# Patient Record
Sex: Female | Born: 1990 | Race: Black or African American | Hispanic: No | Smoking: Never smoker
Health system: Southern US, Community
[De-identification: ages and names within clinical notes are randomized; demographics above are authoritative.]

---

## 2020-08-03 ENCOUNTER — Encounter (HOSPITAL_BASED_OUTPATIENT_CLINIC_OR_DEPARTMENT_OTHER): Payer: Self-pay

## 2020-08-03 ENCOUNTER — Emergency Department (HOSPITAL_BASED_OUTPATIENT_CLINIC_OR_DEPARTMENT_OTHER): Payer: BC Managed Care – PPO

## 2020-08-03 ENCOUNTER — Other Ambulatory Visit: Payer: Self-pay

## 2020-08-03 ENCOUNTER — Emergency Department (HOSPITAL_BASED_OUTPATIENT_CLINIC_OR_DEPARTMENT_OTHER)
Admission: EM | Admit: 2020-08-03 | Discharge: 2020-08-03 | Disposition: A | Discharge status: Home / Self Care | Payer: BC Managed Care – PPO | Attending: Emergency Medicine | Admitting: Emergency Medicine

## 2020-08-03 DIAGNOSIS — Z3A22 22 weeks gestation of pregnancy: Secondary | ICD-10-CM | POA: Insufficient documentation

## 2020-08-03 DIAGNOSIS — O200 Threatened abortion: Secondary | ICD-10-CM

## 2020-08-03 DIAGNOSIS — O209 Hemorrhage in early pregnancy, unspecified: Secondary | ICD-10-CM

## 2020-08-03 LAB — URINALYSIS, ROUTINE W REFLEX MICROSCOPIC
Bilirubin Urine: NEGATIVE
Glucose, UA: NEGATIVE mg/dL
Ketones, ur: NEGATIVE mg/dL
Leukocytes,Ua: NEGATIVE
Nitrite: NEGATIVE
Protein, ur: NEGATIVE mg/dL
Specific Gravity, Urine: 1.009 (ref 1.005–1.030)
pH: 7 (ref 5.0–8.0)

## 2020-08-03 LAB — CBC
HCT: 36 % (ref 36.0–46.0)
Hemoglobin: 11.6 g/dL — ABNORMAL LOW (ref 12.0–15.0)
MCH: 26.9 pg (ref 26.0–34.0)
MCHC: 32.2 g/dL (ref 30.0–36.0)
MCV: 83.3 fL (ref 80.0–100.0)
Platelets: 273 10*3/uL (ref 150–400)
RBC: 4.32 MIL/uL (ref 3.87–5.11)
RDW: 14.5 % (ref 11.5–15.5)
WBC: 7.3 10*3/uL (ref 4.0–10.5)
nRBC: 0 % (ref 0.0–0.2)

## 2020-08-03 LAB — ABO/RH: ABO/RH(D): O POS

## 2020-08-03 LAB — HCG, QUANTITATIVE, PREGNANCY: hCG, Beta Chain, Quant, S: 8 m[IU]/mL — ABNORMAL HIGH (ref <5)

## 2020-08-03 NOTE — ED Provider Notes (Signed)
[facility] EMERGENCY DEPT Provider Note   CSN: [id number] Arrival date & time: 08/03/20  0820     History Chief Complaint  Patient presents with   Vaginal Bleeding    Patricia Kent is a 30 y.o. female.  Patient presents with mild cramping and bleeding for the past few days.  Patient had positive home pregnancy test.  No history of any pregnancies or miscarriage.  No fever chills or vomiting.  Symptoms mild currently.  Patient took a pregnancy test due to missed/delayed menstrual cycle.  Last menstrual cycle early May.  No syncope.  No concerns for STDs.  No urinary symptoms.      History reviewed. No pertinent past medical history.  There are no problems to display for this patient.   History reviewed. No pertinent surgical history.   OB History     Gravida  1   Para      Term      Preterm      AB      Living         SAB      IAB      Ectopic      Multiple      Live Births              No family history on file.  Social History   Tobacco Use   Smoking status: Never   Smokeless tobacco: Never  Substance Use Topics   Alcohol use: Yes    Comment: rarely   Drug use: Never    Home Medications Prior to Admission medications   Medication Sig Start Date End Date Taking? Authorizing Provider  acetaminophen (TYLENOL) 500 MG tablet Take 500 mg by mouth every 6 (six) hours as needed.   Yes [provider]  cetirizine (ZYRTEC) 10 MG tablet Take 10 mg by mouth daily.   Yes [provider]    Allergies    Patient has no known allergies.  Review of Systems   Review of Systems  Constitutional:  Negative for chills and fever.  HENT:  Negative for congestion.   Eyes:  Negative for visual disturbance.  Respiratory:  Negative for shortness of breath.   Cardiovascular:  Negative for chest pain.  Gastrointestinal:  Negative for abdominal pain and vomiting.  Genitourinary:  Positive for vaginal bleeding. Negative  for dysuria, flank pain and vaginal discharge.  Musculoskeletal:  Negative for back pain, neck pain and neck stiffness.  Skin:  Negative for rash.  Neurological:  Negative for light-headedness and headaches.   Physical Exam Updated Vital Signs BP 102/68   Pulse 68   Temp 98.1 F (36.7 C) (Oral)   Resp 16   Ht 5\' 4"  (1.626 m)   Wt 56.7 kg   LMP 06/29/2020 (Exact Date) Comment: 06-29-2020 was first day of LNMP  SpO2 100%   BMI 21.46 kg/m   Physical Exam Vitals and nursing note reviewed.  Constitutional:      General: She is not in acute distress.    Appearance: She is well-developed.  HENT:     Head: Normocephalic and atraumatic.     Mouth/Throat:     Mouth: Mucous membranes are dry.  Eyes:     General:        Right eye: No discharge.        Left eye: No discharge.     Conjunctiva/sclera: Conjunctivae normal.  Neck:     Trachea: No tracheal deviation.  Cardiovascular:  Rate and Rhythm: Normal rate.     Heart sounds: No murmur heard. Pulmonary:     Effort: Pulmonary effort is normal.  Abdominal:     General: There is no distension.     Palpations: Abdomen is soft.     Tenderness: There is no abdominal tenderness. There is no guarding.  Musculoskeletal:     Cervical back: Normal range of motion and neck supple. No rigidity.  Skin:    General: Skin is warm.     Capillary Refill: Capillary refill takes less than 2 seconds.     Findings: No rash.  Neurological:     General: No focal deficit present.     Mental Status: She is alert.     Cranial Nerves: No cranial nerve deficit.  Psychiatric:        Mood and Affect: Mood normal.    ED Results / Procedures / Treatments   Labs (all labs ordered are listed, but only abnormal results are displayed) Labs Reviewed  HCG, QUANTITATIVE, PREGNANCY - Abnormal; Notable for the following components:      Result Value   hCG, Beta Chain, Quant, S 8 (*)    All other components within normal limits  URINALYSIS, ROUTINE W  REFLEX MICROSCOPIC - Abnormal; Notable for the following components:   Hgb urine dipstick LARGE (*)    All other components within normal limits  CBC - Abnormal; Notable for the following components:   Hemoglobin 11.6 (*)    All other components within normal limits  ABO/RH    EKG None  Radiology US OB LESS THAN 14 WEEKS WITH OB TRANSVAGINAL  Result Date: 08/03/2020 CLINICAL DATA:  Vaginal bleeding for several days EXAM: OBSTETRIC <14 WK [place] AND TRANSVAGINAL OB US TECHNIQUE: Both transabdominal and transvaginal ultrasound examinations were performed for complete evaluation of the gestation as well as the maternal uterus, adnexal regions, and pelvic cul-de-sac. Transvaginal technique was performed to assess early pregnancy. COMPARISON:  None. FINDINGS: Intrauterine gestational sac: Absent. No extra uterine gestational sac is noted. Maternal uterus/adnexae: Uterus is retroflexed. Ovaries appear within normal limits. There is a complex area measuring 4.3 cm in greatest dimension in the midline likely representing multiple loops of bowel. IMPRESSION: No definitive intrauterine or extrauterine gestation is identified. Correlation with serial beta HCG levels is recommended. Follow-up imaging can be performed as clinically indicated. Complex area in the midline adjacent to the uterus likely representing a collection of bowel loops. Electronically Signed   By: Alcide Clever M.D.   On: 08/03/2020 11:30    Procedures Procedures   Medications Ordered in ED Medications - No data to display  ED Course  I have reviewed the triage vital signs and the nursing notes.  Pertinent labs & imaging results that were available during my care of the patient were reviewed by me and considered in my medical decision making (see chart for details).    MDM Rules/Calculators/A&P                          Patient presents with mild vaginal bleeding early pregnancy.  Concern clinically for threatened miscarriage versus  inevitable versus early versus ectopic.  hCG ordered and returned at 8 extremely low.  Ultrasound no intrauterine pregnancy seen.  Patient has minimal pain in the ER, normal vitals, normal hemoglobin reviewed.  Rh sent for outpatient follow-up.  Patient is to see OB doctor on Friday.  Work note given.  Urinalysis reviewed no signs of  infection mild hemoglobin.   Final Clinical Impression(s) / ED Diagnoses Final diagnoses:  Vaginal bleeding before [redacted] weeks gestation  Threatened miscarriage    Rx / DC Orders ED Discharge Orders     None        Blane Ohara, MD 08/03/20 1217

## 2020-08-03 NOTE — ED Notes (Signed)
Patient transported to Ultrasound 

## 2020-08-03 NOTE — ED Triage Notes (Signed)
She states she has tested positive for pregnancy. She is here for vag. Bleeding and moderate discomfort at pelvic area. She is in no distress.

## 2020-08-03 NOTE — Discharge Instructions (Addendum)
See OB doctor on Friday for reassessment and likely repeat blood work. Use Tylenol every 4 hours as needed for cramping.

## 2022-06-21 IMAGING — US US OB < 14 WEEKS - US OB TV
1 series · 14 of 28 positions shown · non-contrast
Comparison: None.

CLINICAL DATA: Vaginal bleeding for several days

EXAM:
OBSTETRIC <14 WK US AND TRANSVAGINAL OB US
TECHNIQUE: Both transabdominal and transvaginal ultrasound examinations were
performed for complete evaluation of the gestation as well as the
maternal uterus, adnexal regions, and pelvic cul-de-sac.
Transvaginal technique was performed to assess early pregnancy.

[Series 1: us ob less than 14 weeks with ob transvaginal · 80 acquisitions, 14 frames shown]
[im 3/80]
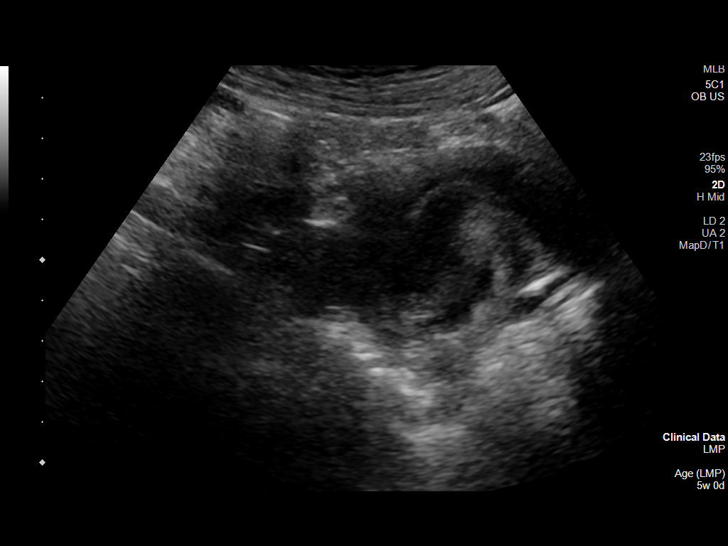
[im 9/80]
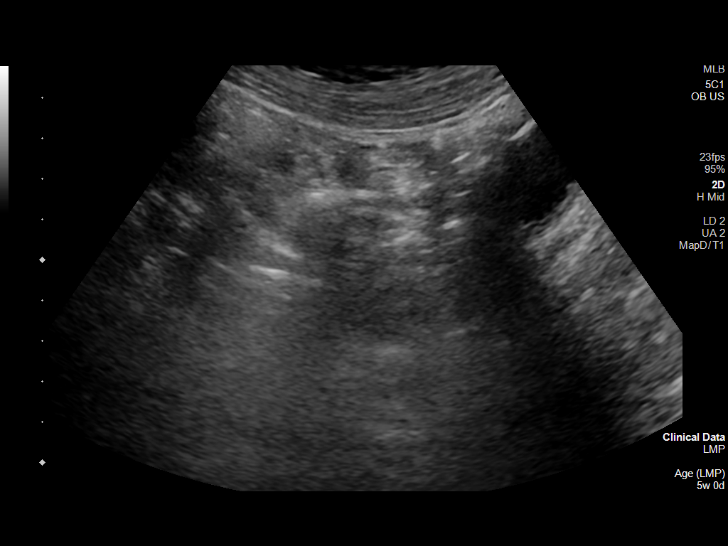
[im 15/80]
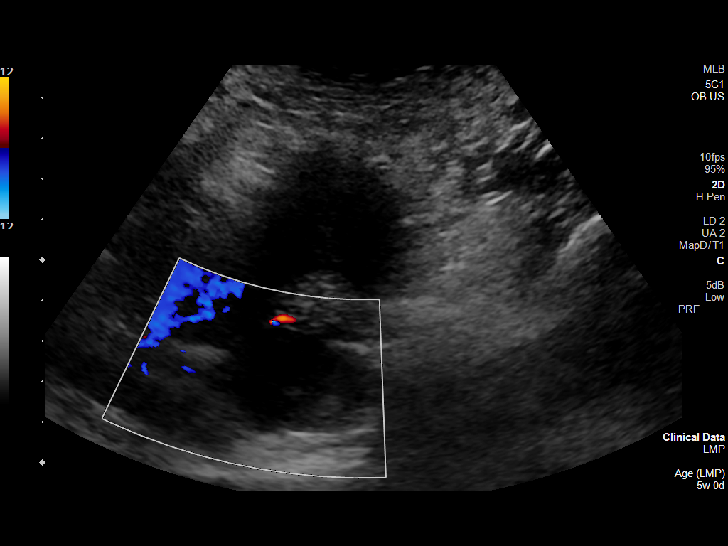
[im 21/80]
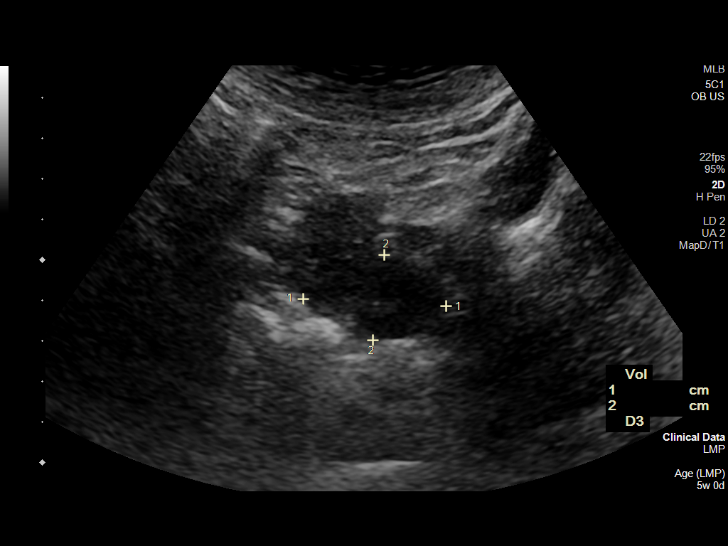
[im 27/80]
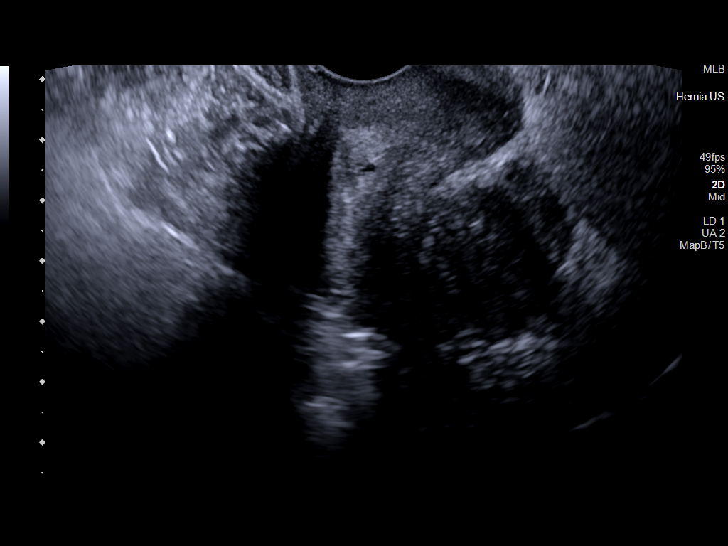
[im 33/80]
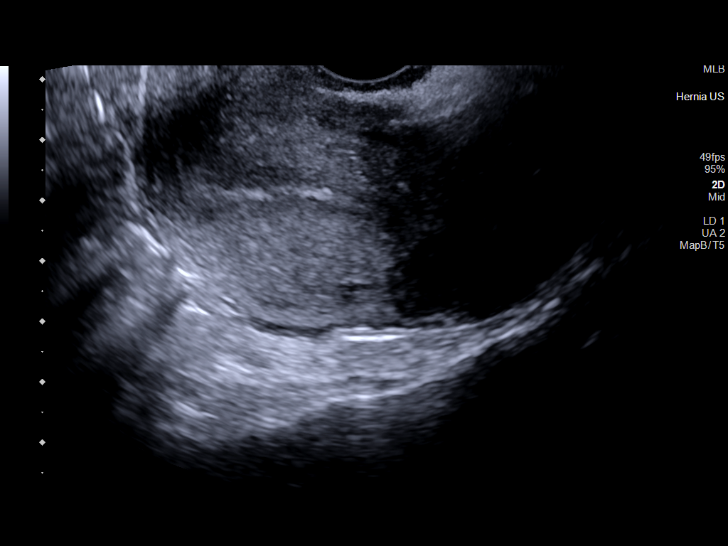
[im 39/80]
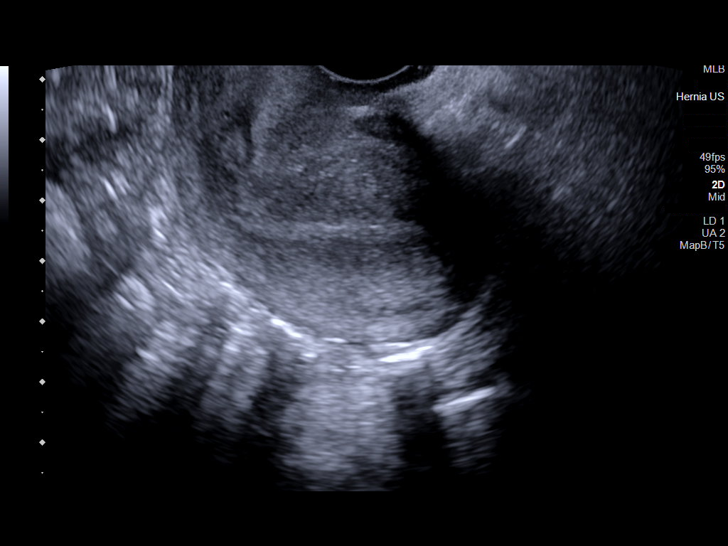
[im 44/80]
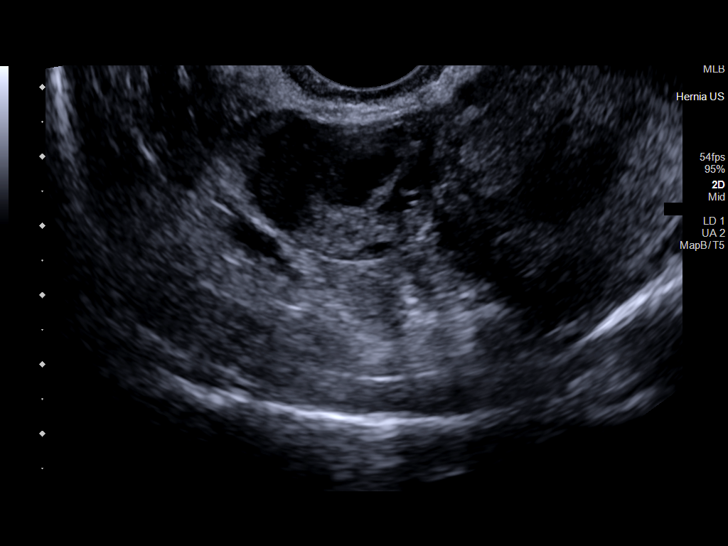
[im 50/80]
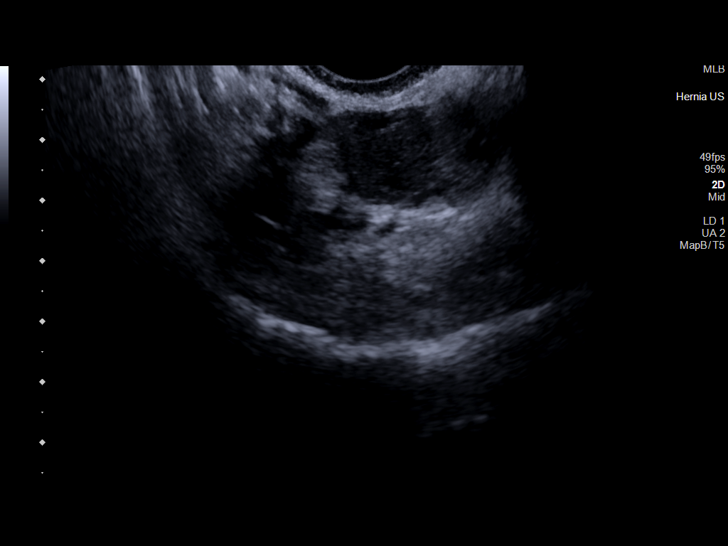
[im 56/80]
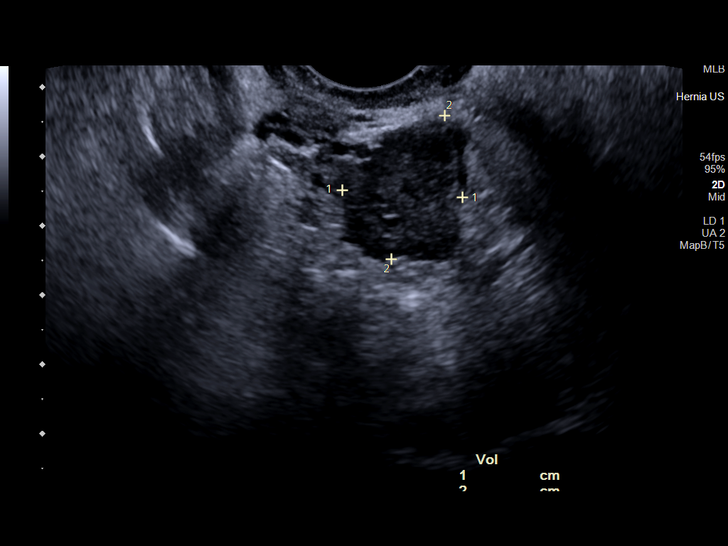
[im 62/80]
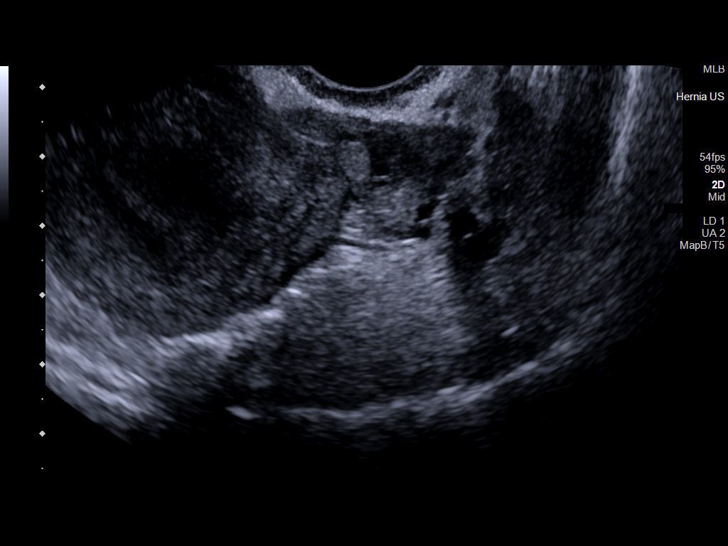
[im 68/80]
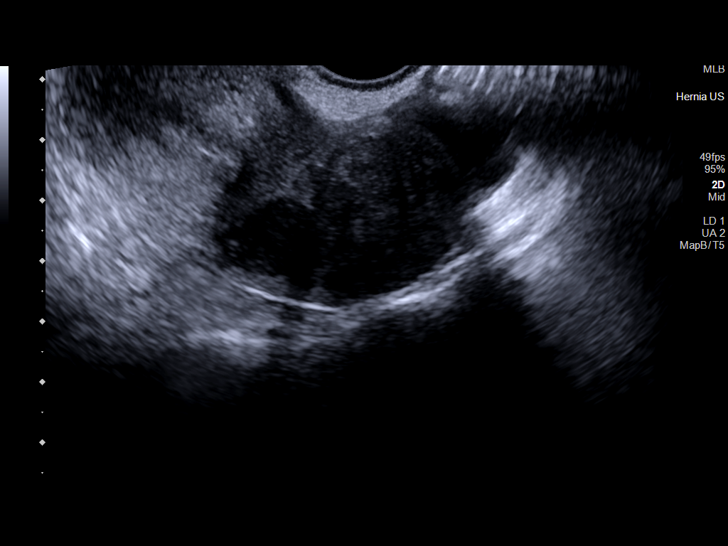
[im 74/80]
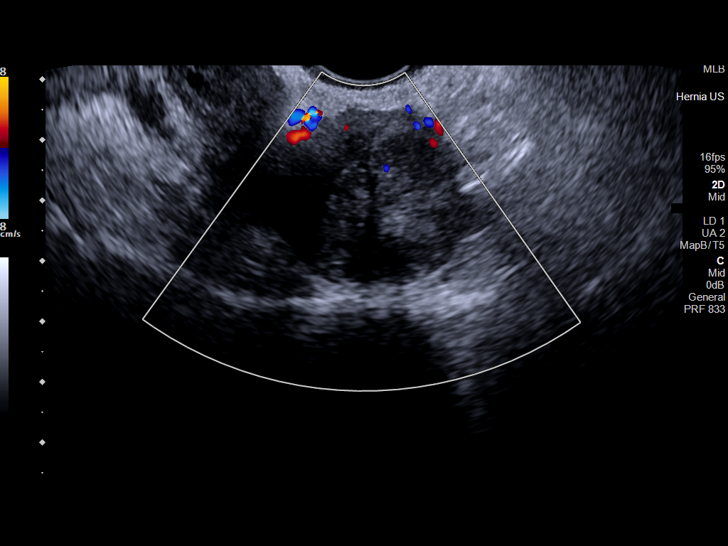
[im 80/80]
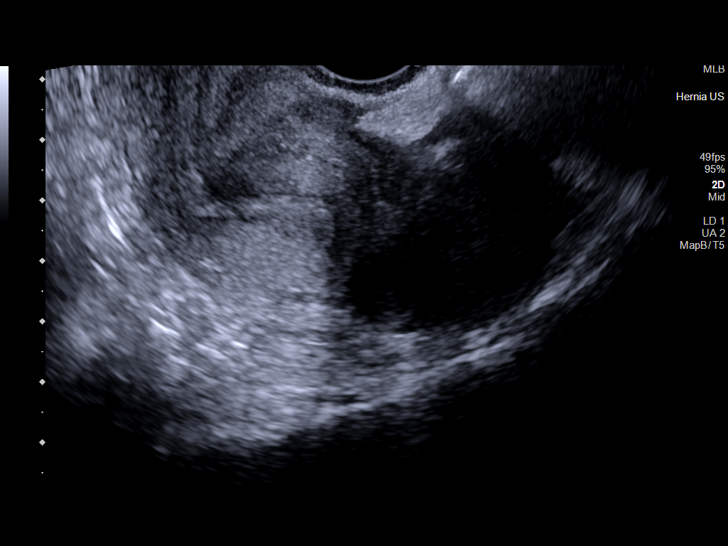

[14 of 28 positions shown; findings below may reference images not displayed]

FINDINGS: Intrauterine gestational sac: Absent. No extra uterine gestational
sac is noted.

Maternal uterus/adnexae: Uterus is retroflexed. Ovaries appear
within normal limits.

There is a complex area measuring 4.3 cm in greatest dimension in
the midline likely representing multiple loops of bowel.
IMPRESSION: No definitive intrauterine or extrauterine gestation is identified.
Correlation with serial beta HCG levels is recommended. Follow-up
imaging can be performed as clinically indicated.

Complex area in the midline adjacent to the uterus likely
representing a collection of bowel loops.
